# Patient Record
Sex: Female | Born: 1985 | Race: Black or African American | Hispanic: No | Marital: Single | Smoking: Current every day smoker
Health system: Southern US, Community
[De-identification: ages and names within clinical notes are randomized; demographics above are authoritative.]

## PROBLEM LIST (undated history)

## (undated) HISTORY — PX: FINGER SURGERY: SHX640

---

## 2004-05-14 ENCOUNTER — Emergency Department: Payer: Self-pay | Admitting: Emergency Medicine

## 2004-05-28 ENCOUNTER — Emergency Department: Payer: Self-pay | Admitting: Emergency Medicine

## 2006-02-24 ENCOUNTER — Emergency Department: Payer: Self-pay | Admitting: Emergency Medicine

## 2007-04-19 ENCOUNTER — Emergency Department: Payer: Self-pay | Admitting: Emergency Medicine

## 2009-11-15 ENCOUNTER — Observation Stay: Payer: Self-pay | Admitting: Obstetrics and Gynecology

## 2009-12-09 ENCOUNTER — Observation Stay: Payer: Self-pay | Admitting: Obstetrics and Gynecology

## 2009-12-10 ENCOUNTER — Observation Stay: Payer: Self-pay | Admitting: Obstetrics and Gynecology

## 2009-12-11 ENCOUNTER — Inpatient Hospital Stay: Payer: Self-pay | Admitting: Obstetrics and Gynecology

## 2010-08-02 ENCOUNTER — Ambulatory Visit: Payer: Self-pay

## 2011-01-29 ENCOUNTER — Emergency Department (HOSPITAL_COMMUNITY)
Admission: EM | Admit: 2011-01-29 | Discharge: 2011-01-29 | Discharge status: Against Medical Advice | Payer: Self-pay | Attending: Emergency Medicine | Admitting: Emergency Medicine

## 2011-01-29 DIAGNOSIS — R1110 Vomiting, unspecified: Secondary | ICD-10-CM | POA: Insufficient documentation

## 2011-01-29 DIAGNOSIS — R109 Unspecified abdominal pain: Secondary | ICD-10-CM | POA: Insufficient documentation

## 2011-09-13 ENCOUNTER — Emergency Department: Payer: Self-pay | Admitting: Emergency Medicine

## 2011-09-13 LAB — CBC
HCT: 39.5 % (ref 35.0–47.0)
MCH: 29.3 pg (ref 26.0–34.0)
MCV: 90 fL (ref 80–100)
Platelet: 188 10*3/uL (ref 150–440)
RBC: 4.36 10*6/uL (ref 3.80–5.20)
WBC: 6.2 10*3/uL (ref 3.6–11.0)

## 2011-09-13 LAB — URINALYSIS, COMPLETE
Blood: NEGATIVE
Glucose,UR: NEGATIVE mg/dL (ref 0–75)
Nitrite: NEGATIVE
Protein: NEGATIVE
RBC,UR: <1 /HPF (ref 0–5)
WBC UR: <1 /HPF (ref 0–5)

## 2011-09-14 LAB — COMPREHENSIVE METABOLIC PANEL
Alkaline Phosphatase: 34 U/L — ABNORMAL LOW (ref 50–136)
BUN: 9 mg/dL (ref 7–18)
Bilirubin,Total: 0.4 mg/dL (ref 0.2–1.0)
Calcium, Total: 8.9 mg/dL (ref 8.5–10.1)
Chloride: 106 mmol/L (ref 98–107)
Creatinine: 0.61 mg/dL (ref 0.60–1.30)
EGFR (African American): >60
EGFR (Non-African Amer.): >60
Glucose: 101 mg/dL — ABNORMAL HIGH (ref 65–99)
Osmolality: 278 (ref 275–301)
SGOT(AST): 23 U/L (ref 15–37)
SGPT (ALT): 17 U/L

## 2013-12-17 ENCOUNTER — Emergency Department: Payer: Self-pay | Admitting: Emergency Medicine

## 2013-12-17 LAB — URINALYSIS, COMPLETE
Bacteria: NONE SEEN
Bilirubin,UR: NEGATIVE
Blood: NEGATIVE
Glucose,UR: NEGATIVE mg/dL (ref 0–75)
Ketone: NEGATIVE
Leukocyte Esterase: NEGATIVE
Nitrite: NEGATIVE
PROTEIN: NEGATIVE
Ph: 6 (ref 4.5–8.0)
SPECIFIC GRAVITY: 1.024 (ref 1.003–1.030)
Squamous Epithelial: 1
WBC UR: 1 /HPF (ref 0–5)

## 2013-12-17 LAB — COMPREHENSIVE METABOLIC PANEL
AST: 24 U/L (ref 15–37)
Albumin: 3.7 g/dL (ref 3.4–5.0)
Alkaline Phosphatase: 29 U/L — ABNORMAL LOW
Anion Gap: 5 — ABNORMAL LOW (ref 7–16)
BUN: 12 mg/dL (ref 7–18)
Bilirubin,Total: 0.3 mg/dL (ref 0.2–1.0)
CO2: 26 mmol/L (ref 21–32)
Calcium, Total: 8.8 mg/dL (ref 8.5–10.1)
Chloride: 108 mmol/L — ABNORMAL HIGH (ref 98–107)
Creatinine: 0.84 mg/dL (ref 0.60–1.30)
EGFR (African American): >60
EGFR (Non-African Amer.): >60
GLUCOSE: 96 mg/dL (ref 65–99)
Osmolality: 277 (ref 275–301)
Potassium: 4.2 mmol/L (ref 3.5–5.1)
SGPT (ALT): 21 U/L (ref 12–78)
Sodium: 139 mmol/L (ref 136–145)
Total Protein: 7.7 g/dL (ref 6.4–8.2)

## 2013-12-17 LAB — CBC
HCT: 38.4 % (ref 35.0–47.0)
HGB: 12.5 g/dL (ref 12.0–16.0)
MCH: 29.1 pg (ref 26.0–34.0)
MCHC: 32.6 g/dL (ref 32.0–36.0)
MCV: 89 fL (ref 80–100)
PLATELETS: 196 10*3/uL (ref 150–440)
RBC: 4.31 10*6/uL (ref 3.80–5.20)
RDW: 15.2 % — AB (ref 11.5–14.5)
WBC: 5.6 10*3/uL (ref 3.6–11.0)

## 2015-10-21 IMAGING — CT CT STONE STUDY
1 of 4 series · 4 of 46 positions shown, 9 images · non-contrast
Comparison: 09/13/2011

CLINICAL DATA: Flank pain.

EXAM:
CT ABDOMEN AND PELVIS WITHOUT CONTRAST
TECHNIQUE: Multidetector CT imaging of the abdomen and pelvis was performed
following the standard protocol without IV contrast.

[Series 4: lung windows · axial · 0.75mm/px · z∈[-804,-744]mm · 4 of 22 slices shown, 9 images]
[im 5/22  soft-tissue]
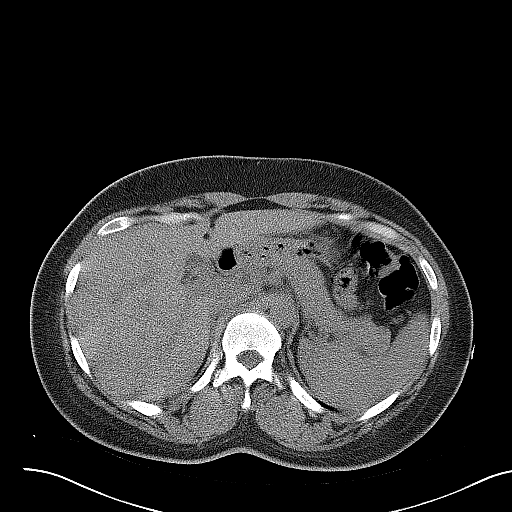
[im 5/22  lung]
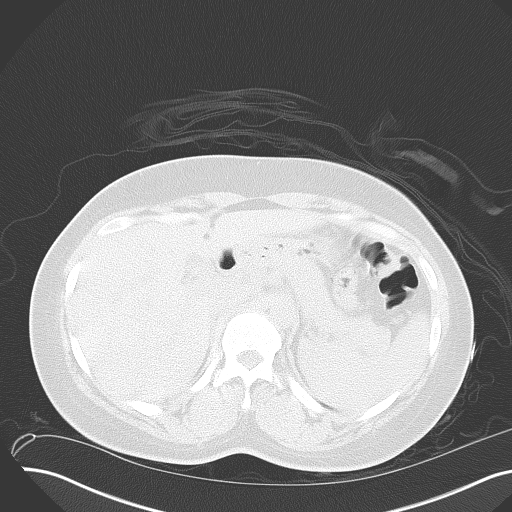
[im 5/22  bone]
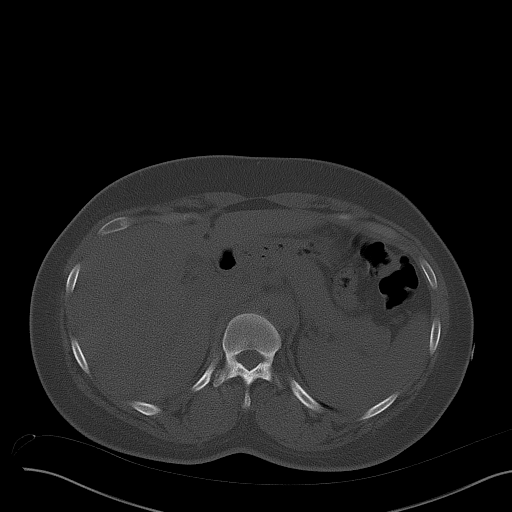
[im 9/22  soft-tissue]
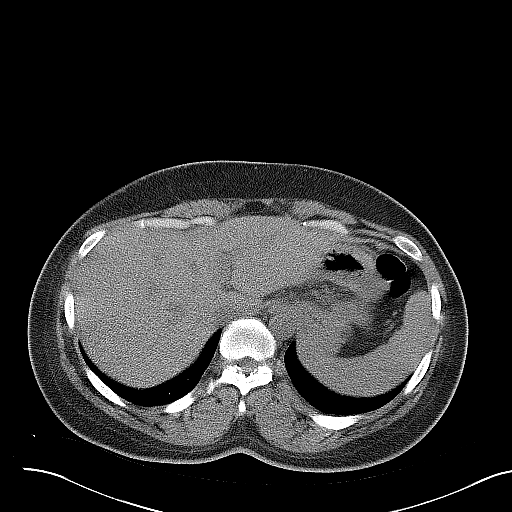
[im 9/22  lung]
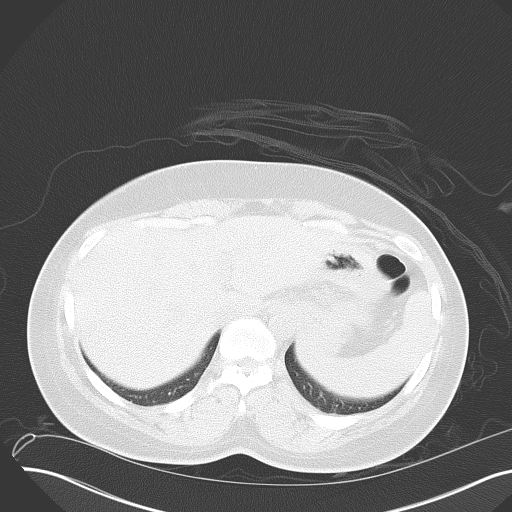
[im 13/22  soft-tissue]
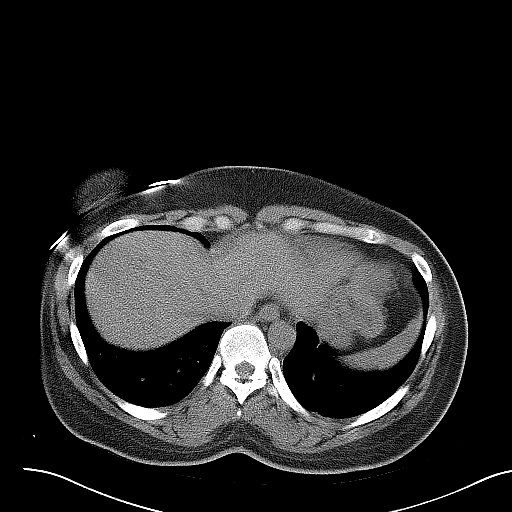
[im 13/22  lung]
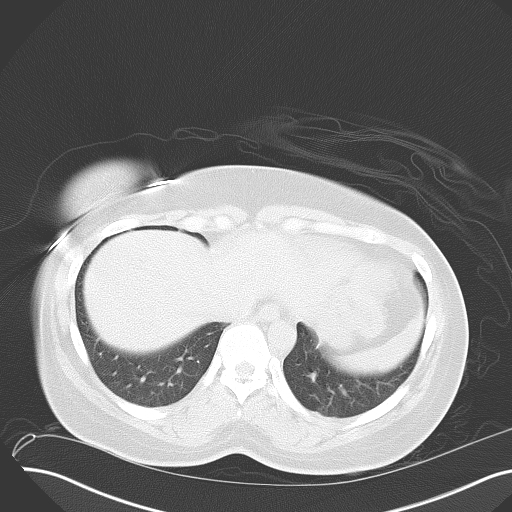
[im 17/22  soft-tissue]
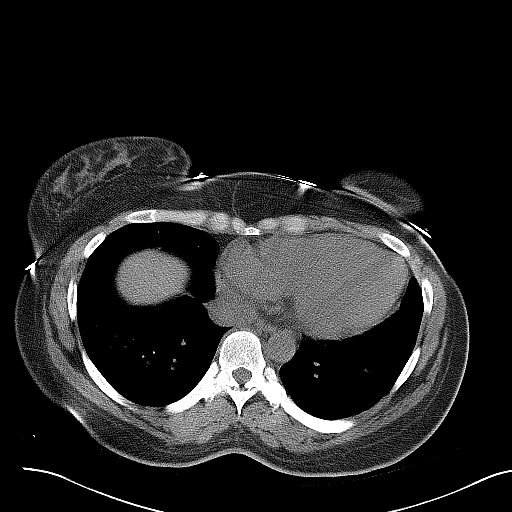
[im 17/22  lung]
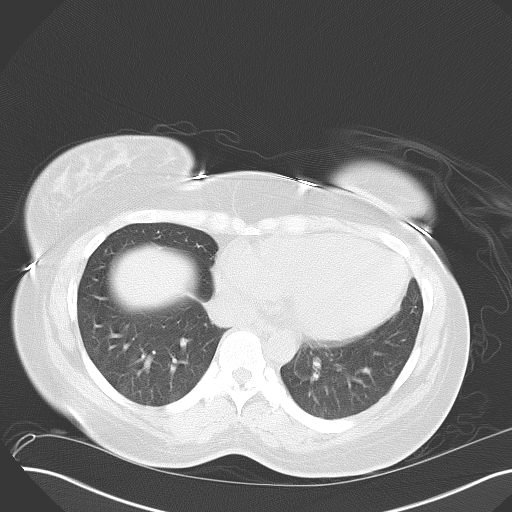

[4 of 46 positions shown; findings below may reference images not displayed]

FINDINGS: Trace dependent atelectasis is noted in the lung bases. There is no
pleural effusion.

The liver, gallbladder, spleen, adrenal glands, kidneys, and
pancreas have an unremarkable unenhanced appearance. No renal
calculi or hydronephrosis is seen. The ureters are nondilated. No
definite ureteral stones are identified, with multiple small
calcifications in the pelvis not appearing significantly changed
from the prior study and compatible with phleboliths.

There is no evidence of bowel obstruction. The appendix is
identified in the right lower quadrant and is unremarkable. A mass
is again seen in the pelvis anterior to the uterus measuring 8.0 x
5.6 x 8.0 cm, containing fat, soft tissue, and calcification. The
mass is in a different orientation than on the prior study and may
be slightly smaller in overall size. No free fluid or enlarged lymph
nodes are identified in the abdomen or pelvis. Osseous structures
are unremarkable.
IMPRESSION: 1. No urinary tract calculi, evidence of obstructive uropathy, or
other acute abnormality identified.
2. Fat containing pelvic mass consistent with a dermoid.

## 2016-11-10 ENCOUNTER — Encounter (HOSPITAL_COMMUNITY): Payer: Self-pay | Admitting: Emergency Medicine

## 2016-11-10 ENCOUNTER — Emergency Department (HOSPITAL_COMMUNITY)
Admission: EM | Admit: 2016-11-10 | Discharge: 2016-11-10 | Disposition: A | Discharge status: Home / Self Care | Payer: Medicaid - Out of State | Attending: Emergency Medicine | Admitting: Emergency Medicine

## 2016-11-10 DIAGNOSIS — L700 Acne vulgaris: Secondary | ICD-10-CM | POA: Insufficient documentation

## 2016-11-10 DIAGNOSIS — R21 Rash and other nonspecific skin eruption: Secondary | ICD-10-CM | POA: Diagnosis present

## 2016-11-10 DIAGNOSIS — F17200 Nicotine dependence, unspecified, uncomplicated: Secondary | ICD-10-CM | POA: Insufficient documentation

## 2016-11-10 MED ORDER — TRETINOIN 0.01 % EX GEL: Freq: Every day | CUTANEOUS | 2 refills | Status: AC

## 2016-11-10 MED ORDER — CLINDAMYCIN PHOSPHATE 1 % EX GEL: Freq: Two times a day (BID) | CUTANEOUS | 0 refills | Status: AC

## 2016-11-10 NOTE — ED Provider Notes (Signed)
[facility] Provider Note   CSN: [id number] Arrival date & time: 11/10/16  0031     History   Chief Complaint Chief Complaint  Patient presents with  . Rash    HPI Nichole Riley is a 31 y.o. female.  Patient presents to the emergency room for evaluation of a rash on her face. Patient reports that the symptoms began in December of last year. She is concerned that she might have had an exposure at work. She has seen primary doctor in the past for this and has had 2 courses of antibiotics prescribed. She reports minimal improvement with antibiotics. She has also been using clotrimazole cream without improvement. Patient reports that the rash is only on her face, nor else on her body. She has not identified any topical creams or cosmetic she has used that causes symptoms.      History reviewed. No pertinent past medical history.  There are no active problems to display for this patient.   History reviewed. No pertinent surgical history.  OB History    No data available       Home Medications    Prior to Admission medications   Medication Sig Start Date End Date Taking? Authorizing Provider  clindamycin (CLINDAGEL) 1 % gel Apply topically 2 (two) times daily. 11/10/16   Gilda Crease, MD  tretinoin (RETIN-A) 0.01 % gel Apply topically at bedtime. 11/10/16   Gilda Crease, MD    Family History History reviewed. No pertinent family history.  Social History Social History  Substance Use Topics  . Smoking status: Current Every Day Smoker  . Smokeless tobacco: Never Used  . Alcohol use Yes     Allergies   Patient has no allergy information on record.   Review of Systems Review of Systems  Skin: Positive for rash.  All other systems reviewed and are negative.    Physical Exam Updated Vital Signs BP 121/77   Pulse 74   Temp 98 F (36.7 C)   Resp 18   Ht 5\' 3"  (1.6 m)   Wt 82.6 kg (182 lb)   LMP 10/26/2016   SpO2 99%   BMI  32.24 kg/m   Physical Exam  Constitutional: She is oriented to person, place, and time. She appears well-developed and well-nourished. No distress.  HENT:  Head: Normocephalic and atraumatic.  Right Ear: Hearing normal.  Left Ear: Hearing normal.  Nose: Nose normal.  Mouth/Throat: Oropharynx is clear and moist and mucous membranes are normal.  Eyes: Conjunctivae and EOM are normal. Pupils are equal, round, and reactive to light.  Neck: Normal range of motion. Neck supple.  Cardiovascular: Regular rhythm, S1 normal and S2 normal.  Exam reveals no gallop and no friction rub.   No murmur heard. Pulmonary/Chest: Effort normal and breath sounds normal. No respiratory distress. She exhibits no tenderness.  Abdominal: Soft. Normal appearance and bowel sounds are normal. There is no hepatosplenomegaly. There is no tenderness. There is no rebound, no guarding, no tenderness at McBurney's point and negative Murphy's sign. No hernia.  Musculoskeletal: Normal range of motion.  Neurological: She is alert and oriented to person, place, and time. She has normal strength. No cranial nerve deficit or sensory deficit. Coordination normal. GCS eye subscore is 4. GCS verbal subscore is 5. GCS motor subscore is 6.  Skin: Skin is warm, dry and intact. Rash (Multiple comedones and scabbed areas on face, mostly on chin and cheeks around mouth, some scattered on forehead) noted. No cyanosis.  Psychiatric: She has a normal mood and affect. Her speech is normal and behavior is normal. Thought content normal.  Nursing note and vitals reviewed.    ED Treatments / Results  Labs (all labs ordered are listed, but only abnormal results are displayed) Labs Reviewed - No data to display  EKG  EKG Interpretation None       Radiology No results found.  Procedures Procedures (including critical care time)  Medications Ordered in ED Medications - No data to display   Initial Impression / Assessment and Plan  / ED Course  I have reviewed the triage vital signs and the nursing notes.  Pertinent labs & imaging results that were available during my care of the patient were reviewed by me and considered in my medical decision making (see chart for details).     Patient with rash isolated to the face that does morphologically look like acne. She has used doxycycline without improvement. We'll recommend topical acne medications and follow-up with dermatology when possible.  Final Clinical Impressions(s) / ED Diagnoses   Final diagnoses:  Acne vulgaris    New Prescriptions New Prescriptions   CLINDAMYCIN (CLINDAGEL) 1 % GEL    Apply topically 2 (two) times daily.   TRETINOIN (RETIN-A) 0.01 % GEL    Apply topically at bedtime.     Gilda CreasePollina, Christopher J, MD 11/10/16 [phone]

## 2016-11-10 NOTE — ED Triage Notes (Signed)
Pt c/o increased rash on face since taking doxycycline and clotrimazole cream.

## 2023-05-23 ENCOUNTER — Ambulatory Visit
Admission: EM | Admit: 2023-05-23 | Discharge: 2023-05-23 | Payer: Medicaid Other | Attending: Family Medicine | Admitting: Family Medicine

## 2023-05-23 DIAGNOSIS — J36 Peritonsillar abscess: Secondary | ICD-10-CM | POA: Diagnosis present

## 2023-05-23 LAB — GROUP A STREP BY PCR: Group A Strep by PCR: NOT DETECTED

## 2023-05-23 NOTE — ED Provider Notes (Signed)
[facility]    CSN: [id number] Arrival date & time: 05/23/23  1319      History   Chief Complaint Chief Complaint  Patient presents with   Sore Throat    HPI Nichole Riley is a 37 y.o. female.   HPI  History obtained from the patient. Nichole Riley presents for sore, left ear pain, left-sided neck pain, left jaw pain and difficulty opening her mouth for the past couple of days. Had some headache and nasal congestion.  No cough, fever and rhinorrhea. No medications prior to arrival.  Female family member/friend notes patient has a muffled voice.  Feels like at times it is difficult for her to get fluids down.  Symptoms get worse at night.       History reviewed. No pertinent past medical history.  There are no active problems to display for this patient.   Past Surgical History:  Procedure Laterality Date   FINGER SURGERY      OB History   No obstetric history on file.      Home Medications    Prior to Admission medications   Medication Sig Start Date End Date Taking? Authorizing Provider  clindamycin (CLINDAGEL) 1 % gel Apply topically 2 (two) times daily. 11/10/16   Gilda Crease, MD  tretinoin (RETIN-A) 0.01 % gel Apply topically at bedtime. 11/10/16   Gilda Crease, MD    Family History History reviewed. No pertinent family history.  Social History Social History   Tobacco Use   Smoking status: Every Day   Smokeless tobacco: Never  Vaping Use   Vaping status: Some Days  Substance Use Topics   Alcohol use: Yes   Drug use: No     Allergies   Patient has no known allergies.   Review of Systems Review of Systems: negative unless otherwise stated in HPI.      Physical Exam Triage Vital Signs ED Triage Vitals  Encounter Vitals Group     BP 05/23/23 1516 132/87     Systolic BP Percentile --      Diastolic BP Percentile --      Pulse Rate 05/23/23 1516 71     Resp 05/23/23 1516 19     Temp 05/23/23 1516 98 F  (36.7 C)     Temp src --      SpO2 05/23/23 1516 99 %     Weight --      Height --      Head Circumference --      Peak Flow --      Pain Score 05/23/23 1514 8     Pain Loc --      Pain Education --      Exclude from Growth Chart --    No data found.  Updated Vital Signs BP 132/87 (BP Location: Left Arm)   Pulse 71   Temp 98 F (36.7 C)   Resp 19   LMP 05/21/2023 (Exact Date)   SpO2 99%   Visual Acuity Right Eye Distance:   Left Eye Distance:   Bilateral Distance:    Right Eye Near:   Left Eye Near:    Bilateral Near:     Physical Exam GEN:     alert, non-toxic appearing female in no distress    HENT:  mucus membranes moist, oropharyngeal without lesions or erythema, significant left tonsillar hypertrophy but no exudates, uvula mostly midline, no nasal discharge, bilateral TM normal EYES:   no scleral injection or discharge NECK:  normal ROM, +lymphadenopathy, no meningismus   RESP:  no increased work of breathing Skin:   warm and dry    UC Treatments / Results  Labs (all labs ordered are listed, but only abnormal results are displayed) Labs Reviewed  GROUP A STREP BY PCR    EKG   Radiology No results found.  Procedures Procedures (including critical care time)  Medications Ordered in UC Medications - No data to display  Initial Impression / Assessment and Plan / UC Course  I have reviewed the triage vital signs and the nursing notes.  Pertinent labs & imaging results that were available during my care of the patient were reviewed by me and considered in my medical decision making (see chart for details).       Pt is a 37 y.o. female who presents for 3 days of neck pain, jaw pain and sore throat.  Lindalee is afebrile here without recent antipyretics. Satting well on room air. Overall pt is non-toxic appearing, well hydrated, without respiratory distress.  She has unilateral tonsillar edema concerning for peritonsillar abscess.  Strep PCR was  obtained and was negative.  Giving her difficulty opening her mouth lymphadenopathy and unilateral tonsillar edema recommended ED evaluation to assess for peritonsillar abscess drainage.  She notes she has never had anything like this before.  Patient is stable for discharge to [facility] ED. MDM, treatment plan and plan for follow-up with patient who agrees with plan.     Final Clinical Impressions(s) / UC Diagnoses   Final diagnoses:  Peritonsillar abscess     Discharge Instructions      Your strep test is negative.  I am concerned that your have a peritonsillar abscess that may need to be drained.   You have been advised to follow up immediately in the emergency department for concerning signs or symptoms as discussed during your visit. If you declined EMS transport, please have a family member take you directly to the ED at this time. Do not delay.   Based on concerns about condition, if you do not follow up in the ED, you may risk poor outcomes including worsening of condition, delayed treatment and potentially life threatening issues. If you have declined to go to the ED at this time, you should call your PCP immediately to set up a follow up appointment.      ED Prescriptions   None    PDMP not reviewed this encounter.   Katha Cabal, DO 05/23/23 1608

## 2023-05-23 NOTE — ED Notes (Signed)
Patient is being discharged from the Urgent Care and sent to the Emergency Department via personal vehicle. Per Dr. Rachael Darby, patient is in need of higher level of care due to peritonsillar abscess. Patient is aware and verbalizes understanding of plan of care.  Vitals:   05/23/23 1516  BP: 132/87  Pulse: 71  Resp: 19  Temp: 98 F (36.7 C)  SpO2: 99%

## 2023-05-23 NOTE — Discharge Instructions (Addendum)
Your strep test is negative.  I am concerned that your have a peritonsillar abscess that may need to be drained.   You have been advised to follow up immediately in the emergency department for concerning signs or symptoms as discussed during your visit. If you declined EMS transport, please have a family member take you directly to the ED at this time. Do not delay.   Based on concerns about condition, if you do not follow up in the ED, you may risk poor outcomes including worsening of condition, delayed treatment and potentially life threatening issues. If you have declined to go to the ED at this time, you should call your PCP immediately to set up a follow up appointment.

## 2023-05-23 NOTE — ED Triage Notes (Addendum)
Sore throat x 3 days Left ear pain
